# Patient Record
Sex: Male | Born: 1990 | Race: Black or African American | Hispanic: No | Marital: Single | Smoking: Current every day smoker
Health system: Southern US, Community
[De-identification: ages and names within clinical notes are randomized; demographics above are authoritative.]

## PROBLEM LIST (undated history)

## (undated) HISTORY — PX: OTHER SURGICAL HISTORY: SHX169

---

## 2010-10-14 ENCOUNTER — Emergency Department (HOSPITAL_COMMUNITY)
Admission: EM | Admit: 2010-10-14 | Discharge: 2010-10-14 | Disposition: A | Discharge status: Home / Self Care | Payer: Self-pay | Attending: Emergency Medicine | Admitting: Emergency Medicine

## 2010-10-14 ENCOUNTER — Emergency Department (HOSPITAL_COMMUNITY): Payer: Self-pay

## 2010-10-14 DIAGNOSIS — S8990XA Unspecified injury of unspecified lower leg, initial encounter: Secondary | ICD-10-CM | POA: Insufficient documentation

## 2010-10-14 DIAGNOSIS — X500XXA Overexertion from strenuous movement or load, initial encounter: Secondary | ICD-10-CM | POA: Insufficient documentation

## 2010-10-14 DIAGNOSIS — M25579 Pain in unspecified ankle and joints of unspecified foot: Secondary | ICD-10-CM | POA: Insufficient documentation

## 2010-10-14 DIAGNOSIS — Y9383 Activity, rough housing and horseplay: Secondary | ICD-10-CM | POA: Insufficient documentation

## 2010-10-14 DIAGNOSIS — S99929A Unspecified injury of unspecified foot, initial encounter: Secondary | ICD-10-CM | POA: Insufficient documentation

## 2010-10-14 DIAGNOSIS — Y929 Unspecified place or not applicable: Secondary | ICD-10-CM | POA: Insufficient documentation

## 2012-01-31 ENCOUNTER — Encounter (HOSPITAL_COMMUNITY): Payer: Self-pay | Admitting: *Deleted

## 2012-01-31 ENCOUNTER — Emergency Department (HOSPITAL_COMMUNITY)
Admission: EM | Admit: 2012-01-31 | Discharge: 2012-01-31 | Disposition: A | Discharge status: Home / Self Care | Payer: Self-pay | Attending: Emergency Medicine | Admitting: Emergency Medicine

## 2012-01-31 DIAGNOSIS — N342 Other urethritis: Secondary | ICD-10-CM | POA: Insufficient documentation

## 2012-01-31 DIAGNOSIS — Z202 Contact with and (suspected) exposure to infections with a predominantly sexual mode of transmission: Secondary | ICD-10-CM | POA: Insufficient documentation

## 2012-01-31 MED ORDER — METRONIDAZOLE 500 MG PO TABS
2000.0000 mg | ORAL_TABLET | Freq: Once | ORAL | Status: AC
Start: 2012-01-31 — End: 2012-01-31
  Administered 2012-01-31: 2000 mg via ORAL
  Filled 2012-01-31: qty 4

## 2012-01-31 MED ORDER — AZITHROMYCIN 1 G PO PACK
1.0000 g | PACK | Freq: Once | ORAL | Status: AC
  Administered 2012-01-31: 1 g via ORAL
  Filled 2012-01-31: qty 1

## 2012-01-31 MED ORDER — LIDOCAINE HCL (PF) 1 % IJ SOLN
INTRAMUSCULAR | Status: AC
  Administered 2012-01-31: 5 mL
  Filled 2012-01-31: qty 5

## 2012-01-31 MED ORDER — ONDANSETRON 4 MG PO TBDP
8.0000 mg | ORAL_TABLET | Freq: Once | ORAL | Status: AC
  Administered 2012-01-31: 8 mg via ORAL
  Filled 2012-01-31: qty 2

## 2012-01-31 MED ORDER — CEFTRIAXONE SODIUM 250 MG IJ SOLR
250.0000 mg | Freq: Once | INTRAMUSCULAR | Status: AC
  Administered 2012-01-31: 250 mg via INTRAMUSCULAR
  Filled 2012-01-31: qty 250

## 2012-01-31 MED ORDER — LIDOCAINE HCL (CARDIAC) 20 MG/ML IV SOLN
INTRAVENOUS | Status: AC
  Filled 2012-01-31: qty 5

## 2012-01-31 NOTE — ED Notes (Signed)
Reports having green penile discharge today. No other complaints, no distress noted at triage.

## 2012-01-31 NOTE — ED Notes (Signed)
AIDET performed. 

## 2012-01-31 NOTE — ED Provider Notes (Signed)
History    Scribed for [name], MD, the patient was seen in room TR07C/TR07C. This chart was scribed by Katha Cabal.   CSN: [id number]  Arrival date & time 01/31/12  1147   First MD Initiated Contact with Patient 01/31/12 1205      Chief Complaint  Patient presents with  . SEXUALLY TRANSMITTED DISEASE    (Consider location/radiation/quality/duration/timing/severity/associated sxs/prior treatment) HPI [name], MD entered patient's room at 12:07 PM   Ricardo Barajas is a 21 y.o. male who presents to the Emergency Department complaining of spontaneous green penile discharge today and mild left testicle pain last night that resolved.  Symptoms are associated with mild dysuria.  Past has history of chlamydia.   Symptoms are not associated with fever, vomiting, myalgias, back pain, or abdominal pain. No history of chronic medical conditions.       History reviewed. No pertinent past medical history.  History reviewed. No pertinent past surgical history.  History reviewed. No pertinent family history.  History  Substance Use Topics  . Smoking status: Current Everyday Smoker    Types: Cigarettes  . Smokeless tobacco: Not on file  . Alcohol Use: No      Review of Systems 10 Systems reviewed and all are negative for acute change except as noted in the HPI.   Allergies  Review of patient's allergies indicates no known allergies.  Home Medications  No current outpatient prescriptions on file.  BP 137/77  Pulse 60  Temp 98.4 F (36.9 C) (Oral)  Resp 18  SpO2 99%  Physical Exam  Nursing note and vitals reviewed. Constitutional:       Awake, alert, nontoxic appearance.  HENT:  Head: Atraumatic.  Eyes: Right eye exhibits no discharge. Left eye exhibits no discharge.  Neck: Neck supple.  Cardiovascular: Normal rate, regular rhythm and normal heart sounds.   No murmur heard. Pulmonary/Chest: Effort normal and breath sounds normal. No respiratory distress.  He exhibits no tenderness.  Abdominal: Soft. There is no tenderness. There is no rebound.  Genitourinary: Right testis shows no tenderness. Left testis shows no tenderness. Discharge found.       Spontaneous purulent drainage, no rash  Musculoskeletal: He exhibits no tenderness.       Baseline ROM, no obvious new focal weakness.  Lymphadenopathy:       Right: No inguinal adenopathy present.       Left: No inguinal adenopathy present.  Neurological:       Mental status and motor strength appears baseline for patient and situation.  Skin: No rash noted.  Psychiatric: He has a normal mood and affect.    ED Course  Procedures (including critical care time)   DIAGNOSTIC STUDIES: Oxygen Saturation is 99% on room air, normal by my interpretation.     COORDINATION OF CARE: 12:19 PM  Physical exam complete.  GC/chlamidya testing.  12:30  PM  Zithromax, Rocephin, Flagyl, and Zofran-ODT ordered.      LABS / RADIOLOGY:   Labs Reviewed  GC/CHLAMYDIA PROBE AMP, GENITAL - Abnormal; Notable for the following:    GC Probe Amp, Genital POSITIVE (*)     Chlamydia, DNA Probe POSITIVE (*)     All other components within normal limits  LAB REPORT - SCANNED   No results found.       MDM             MEDICATIONS GIVEN IN THE E.D. Scheduled Meds:    Continuous Infusions:  IMPRESSION: 1. Urethritis      NEW MEDICATIONS: New Prescriptions   No medications on file      I personally performed the services described in this documentation, which was scribed in my presence. The recorded information has been reviewed and considered.  Patient / Family / Caregiver informed of clinical course, understand medical decision-making process, and agree with plan.       [name], MD 02/06/12 [phone]

## 2012-02-02 LAB — GC/CHLAMYDIA PROBE AMP, GENITAL: GC Probe Amp, Genital: POSITIVE — AB

## 2012-02-03 NOTE — ED Notes (Signed)
+   Chlamydia/+ Gonorrhea Patient treated with Rocephin and Zithromax-chart appended per protocol MD. [facility] letter faxed.

## 2012-02-05 NOTE — ED Notes (Signed)
Called patient and notified them of +result.

## 2012-04-25 IMAGING — CR DG ANKLE COMPLETE 3+V*R*
3 series · 3 of 3 positions shown · non-contrast
Comparison: None.

CLINICAL DATA: Twisting injury to the right ankle 1 month ago.
Persistent medial ankle pain.

RIGHT ANKLE - COMPLETE 3+ VIEW 10/14/2010:

[t ankle joint ap right]
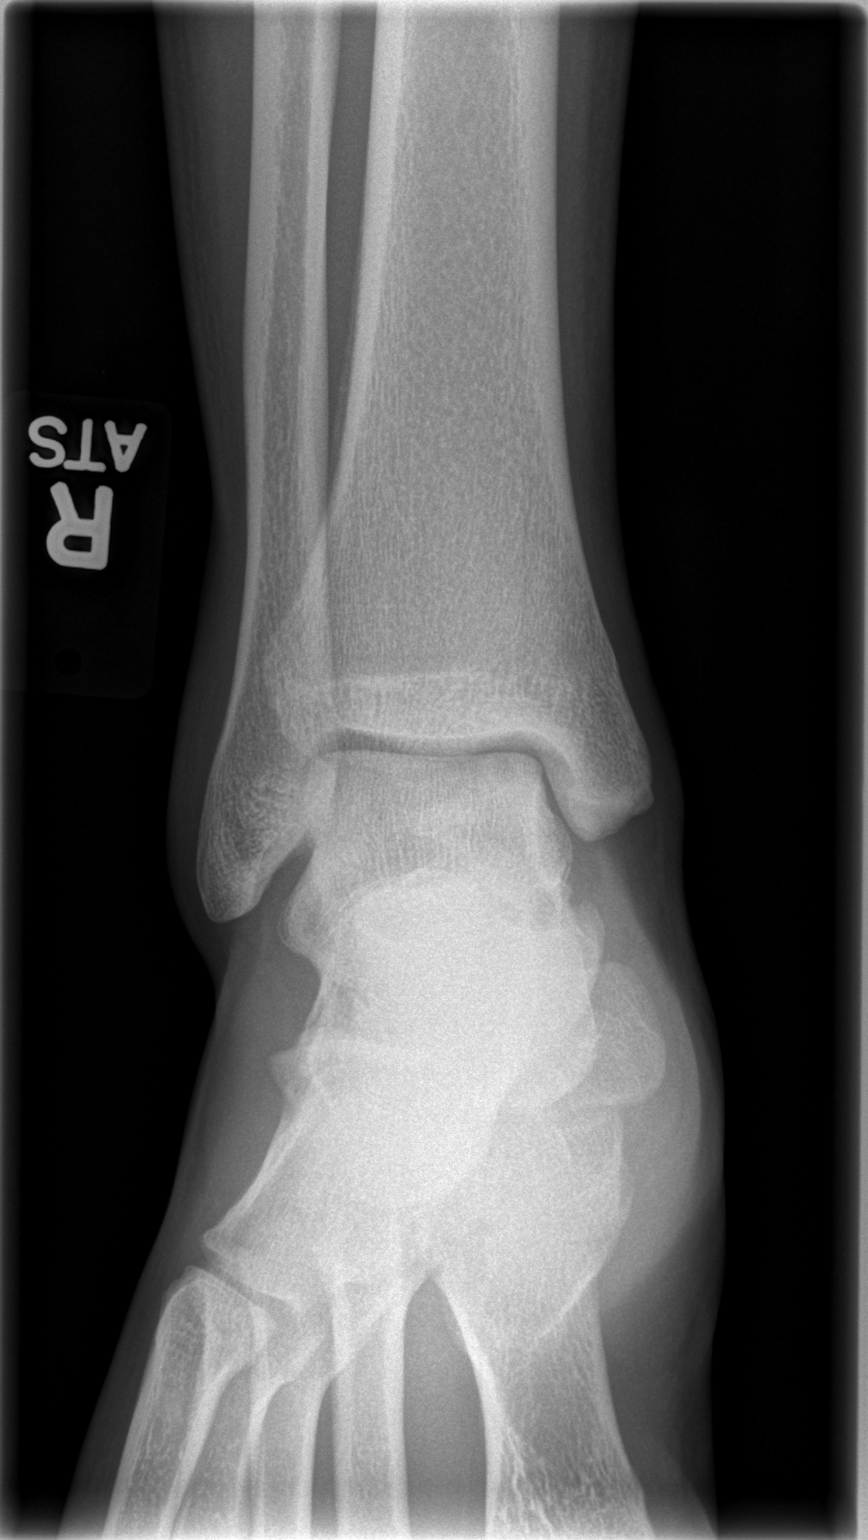

[t ankle joint oblique right]
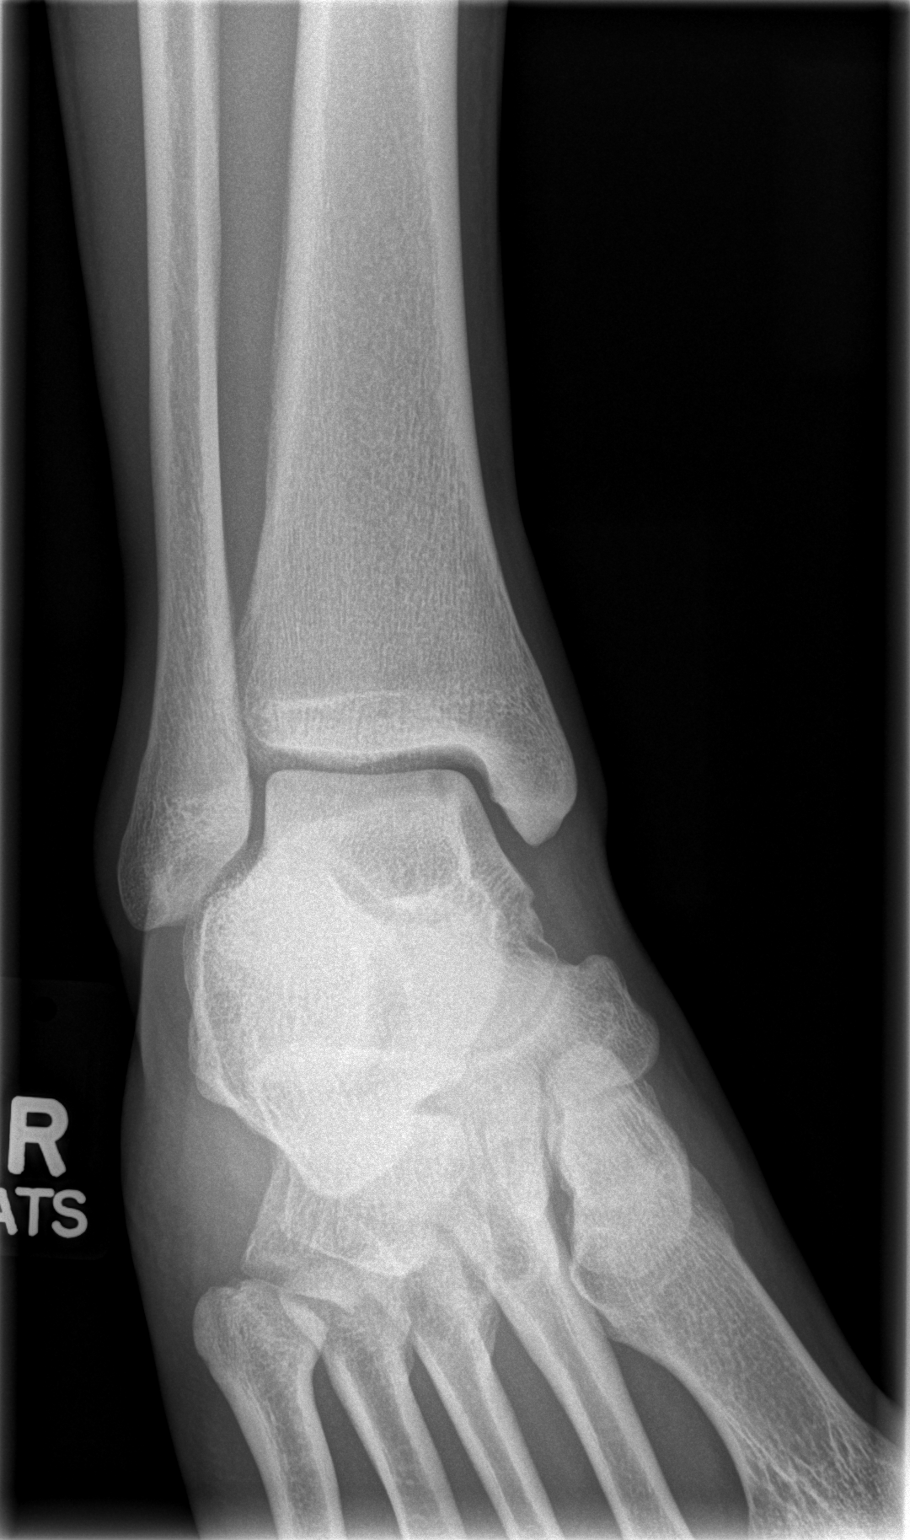

[t ankle joint lat right]
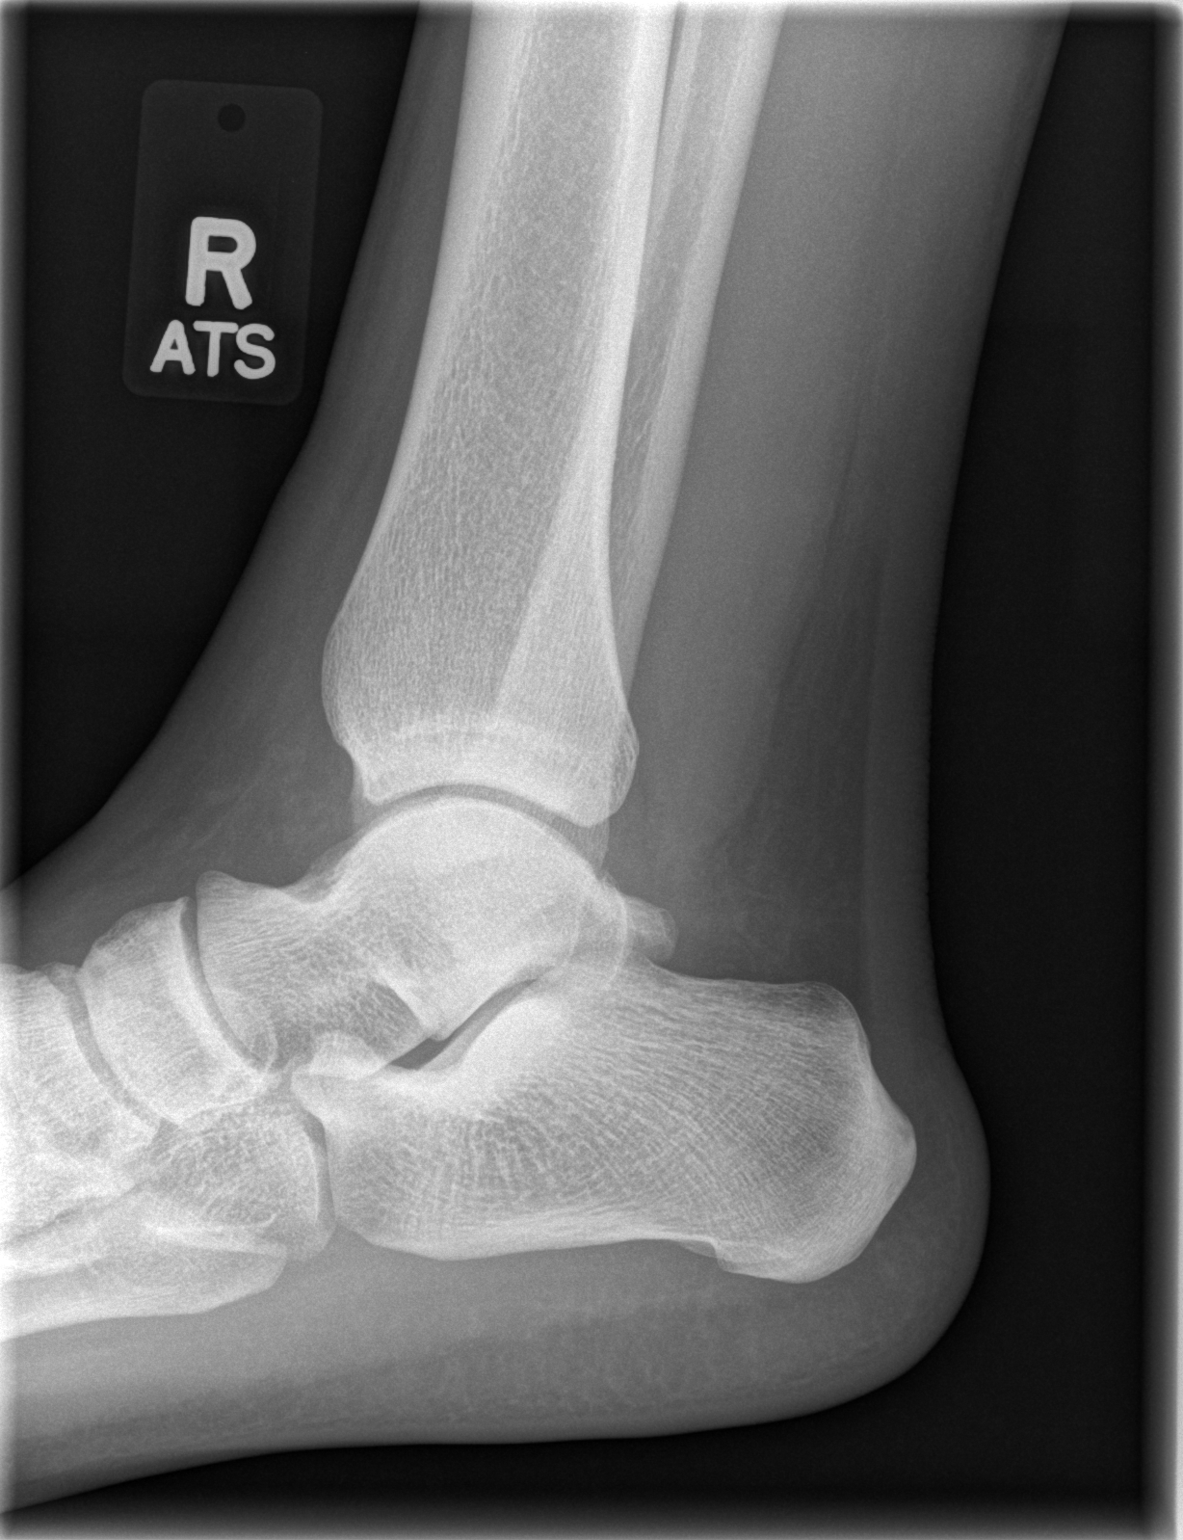

[3 of 3 positions shown; findings below may reference images not displayed]

FINDINGS: No evidence of acute, subacute, or healed fractures.
Ankle mortise intact with well-preserved joint space.  No intrinsic
osseous abnormalities.  No evidence of joint effusion.
IMPRESSION: Normal examination.

## 2015-10-29 ENCOUNTER — Encounter (HOSPITAL_COMMUNITY): Payer: Self-pay

## 2015-10-29 ENCOUNTER — Emergency Department (HOSPITAL_COMMUNITY)
Admission: EM | Admit: 2015-10-29 | Discharge: 2015-10-29 | Disposition: A | Discharge status: Home / Self Care | Payer: Self-pay | Attending: Emergency Medicine | Admitting: Emergency Medicine

## 2015-10-29 DIAGNOSIS — Z202 Contact with and (suspected) exposure to infections with a predominantly sexual mode of transmission: Secondary | ICD-10-CM

## 2015-10-29 DIAGNOSIS — R300 Dysuria: Secondary | ICD-10-CM | POA: Insufficient documentation

## 2015-10-29 DIAGNOSIS — F17210 Nicotine dependence, cigarettes, uncomplicated: Secondary | ICD-10-CM | POA: Insufficient documentation

## 2015-10-29 LAB — URINALYSIS, ROUTINE W REFLEX MICROSCOPIC
Bilirubin Urine: NEGATIVE
GLUCOSE, UA: NEGATIVE mg/dL
Hgb urine dipstick: NEGATIVE
Ketones, ur: NEGATIVE mg/dL
LEUKOCYTES UA: NEGATIVE
Nitrite: NEGATIVE
PH: 6 (ref 5.0–8.0)
PROTEIN: NEGATIVE mg/dL
Specific Gravity, Urine: 1.02 (ref 1.005–1.030)

## 2015-10-29 MED ORDER — LIDOCAINE HCL (PF) 1 % IJ SOLN
INTRAMUSCULAR | Status: AC
  Filled 2015-10-29: qty 5

## 2015-10-29 MED ORDER — AZITHROMYCIN 250 MG PO TABS
1000.0000 mg | ORAL_TABLET | Freq: Once | ORAL | Status: AC
  Administered 2015-10-29: 1000 mg via ORAL
  Filled 2015-10-29: qty 4

## 2015-10-29 MED ORDER — CEFTRIAXONE SODIUM 250 MG IJ SOLR
250.0000 mg | Freq: Once | INTRAMUSCULAR | Status: AC
  Administered 2015-10-29: 250 mg via INTRAMUSCULAR
  Filled 2015-10-29: qty 250

## 2015-10-29 NOTE — ED Provider Notes (Signed)
CSN: [id number]     Arrival date & time 10/29/15  1848 History   First MD Initiated Contact with Patient 10/29/15 1909     Chief Complaint  Patient presents with  . S74.5      (Consider location/radiation/quality/duration/timing/severity/associated sxs/prior Treatment) Patient is a 25 y.o. male presenting with dysuria. The history is provided by the patient.  Dysuria This is a new problem. The current episode started in the past 7 days. The problem occurs constantly. The problem has been gradually worsening.   Ricardo Barajas is a 25 y.o. male who presents to the ED with dysuria that started 3 days ago. Patient reports he has been with his regular sex partner x 1 year but one week ago he had sex with someone else. A couple days after that he began having the dysuria. He denies any other problems.   History reviewed. No pertinent past medical history. Past Surgical History  Procedure Laterality Date  . Testicular torsion     No family history on file. Social History  Substance Use Topics  . Smoking status: Current Every Day Smoker    Types: Cigarettes  . Smokeless tobacco: None  . Alcohol Use: No    Review of Systems  Genitourinary: Positive for dysuria.  all other systems negative    Allergies  Review of patient's allergies indicates no known allergies.  Home Medications   Prior to Admission medications   Not on File   BP 127/70 mmHg  Pulse 88  Temp(Src) 98.7 F (37.1 C) (Oral)  Resp 18  Ht 5\' 8"  (1.727 m)  Wt 75.751 kg  BMI 25.40 kg/m2  SpO2 97% Physical Exam  Constitutional: He is oriented to person, place, and time. He appears well-developed and well-nourished.  HENT:  Head: Normocephalic and atraumatic.  Eyes: EOM are normal.  Neck: Neck supple.  Cardiovascular: Normal rate.   Pulmonary/Chest: Effort normal.  Abdominal: Soft. There is no tenderness.  Genitourinary: Testes normal. Circumcised. No discharge found.  Musculoskeletal: Normal range of motion.   Lymphadenopathy:       Right: No inguinal adenopathy present.       Left: No inguinal adenopathy present.  Neurological: He is alert and oriented to person, place, and time. No cranial nerve deficit.  Skin: Skin is warm and dry.  Psychiatric: He has a normal mood and affect. His behavior is normal.  Nursing note and vitals reviewed.   ED Course  Procedures (including critical care time) Labs Review Labs Reviewed  RPR  HIV ANTIBODY (ROUTINE TESTING)  URINALYSIS, ROUTINE W REFLEX MICROSCOPIC ([facility])  GC/CHLAMYDIA PROBE AMP ([place]) [facility]   GC, Chlamydia, RPR and HIV pending.   MDM  25 y.o. male with dysuria that started a few days after having sex with a new partner. Stable for d/c without fever and does not appear toxic. Normal urine.  Rocephin 250 mg. IM and Zithromax 1 gram PO given her today. Discussed with patient that cultures will not be back for 2 days nor will blood work. Discussed that the HIV results today will be his baseline and he will need to f/u with the health department. Patient voices understanding and agrees with plan.    Final diagnoses:  Dysuria  Possible exposure to STD       [facility], NP 10/29/15 2023  Mancel BaleElliott Wentz, MD 10/30/15 1053

## 2015-10-29 NOTE — ED Notes (Signed)
Pt reports burning with urination x 3 days.  Denies any discharge.  Pt says, "when I pee, it doesn't feel like all of the pee comes out."

## 2015-10-29 NOTE — Discharge Instructions (Signed)
The HIV results will only be your base line. You will need to follow up with the Health Department to have the test repeated to determine if you were exposed.

## 2015-10-31 LAB — GC/CHLAMYDIA PROBE AMP (~~LOC~~) NOT AT ARMC
Chlamydia: NEGATIVE
Neisseria Gonorrhea: NEGATIVE

## 2015-10-31 LAB — HIV ANTIBODY (ROUTINE TESTING W REFLEX): HIV SCREEN 4TH GENERATION: NONREACTIVE

## 2015-10-31 LAB — RPR: RPR: NONREACTIVE

## 2019-10-26 DIAGNOSIS — Z0000 Encounter for general adult medical examination without abnormal findings: Secondary | ICD-10-CM | POA: Diagnosis not present

## 2019-11-24 DIAGNOSIS — Z113 Encounter for screening for infections with a predominantly sexual mode of transmission: Secondary | ICD-10-CM | POA: Diagnosis not present

## 2019-11-30 DIAGNOSIS — Z6826 Body mass index (BMI) 26.0-26.9, adult: Secondary | ICD-10-CM | POA: Diagnosis not present

## 2019-11-30 DIAGNOSIS — M25512 Pain in left shoulder: Secondary | ICD-10-CM | POA: Diagnosis not present
